# Patient Record
Sex: Male | Born: 2005 | Race: Black or African American | Hispanic: No | Marital: Single | Smoking: Never smoker
Health system: Southern US, Community
[De-identification: ages and names within clinical notes are randomized; demographics above are authoritative.]

---
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
## 2023-05-19 ENCOUNTER — Ambulatory Visit
Admission: EM | Admit: 2023-05-19 | Discharge: 2023-05-19 | Disposition: A | Discharge status: Home / Self Care | Payer: BC Managed Care – PPO | Attending: Nurse Practitioner | Admitting: Nurse Practitioner

## 2023-05-19 DIAGNOSIS — Z113 Encounter for screening for infections with a predominantly sexual mode of transmission: Secondary | ICD-10-CM | POA: Insufficient documentation

## 2023-05-19 DIAGNOSIS — R360 Urethral discharge without blood: Secondary | ICD-10-CM | POA: Diagnosis present

## 2023-05-19 DIAGNOSIS — R300 Dysuria: Secondary | ICD-10-CM | POA: Diagnosis present

## 2023-05-19 LAB — POCT URINALYSIS DIP (MANUAL ENTRY)
Bilirubin, UA: NEGATIVE
Blood, UA: NEGATIVE
Glucose, UA: NEGATIVE mg/dL
Ketones, POC UA: NEGATIVE mg/dL
Leukocytes, UA: NEGATIVE
Nitrite, UA: NEGATIVE
Protein Ur, POC: NEGATIVE mg/dL
Spec Grav, UA: 1.025 (ref 1.010–1.025)
Urobilinogen, UA: 0.2 E.U./dL
pH, UA: 8.5 — AB (ref 5.0–8.0)

## 2023-05-19 NOTE — ED Triage Notes (Signed)
Pt reports he has some burning with urination, cough, and penile discharge x 4 days.   Pt had unprotected sex with various partners about 2-3 weeks ago.

## 2023-05-19 NOTE — Discharge Instructions (Addendum)
Your test results will be available within the next 48 to 72 hours.  Refrain from sexual intercourse until you have received your test results. You will be contacted if the pending test results are positive to discuss treatment. Recommend that you use condoms with each sexual encounter. Follow-up as needed.

## 2023-05-19 NOTE — ED Provider Notes (Signed)
[facility]    CSN: [id number] Arrival date & time: 05/19/23  1837      History   Chief Complaint No chief complaint on file.   HPI Jim Parker is a 17 y.o. male.   The history is provided by the patient.   The patient presents for complaints of penile discharge and dysuria for the past 4 days.  Patient denies fever, chills, abdominal pain, urinary urgency, frequency, hesitancy,/testicular pain/swelling, low back pain, or flank pain.  Patient reports about 2-3 sexual partners within the past 90 days.  He reports prior history of chlamydia.  History reviewed. No pertinent past medical history.  There are no problems to display for this patient.   History reviewed. No pertinent surgical history.     Home Medications    Prior to Admission medications   Medication Sig Start Date End Date Taking? Authorizing Provider  albuterol (VENTOLIN HFA) 108 (90 Base) MCG/ACT inhaler Inhale 2 puffs into the lungs every 4 (four) hours as needed for wheezing or shortness of breath. 07/01/22   Berna Bue, MD  doxycycline (MONODOX) 100 MG capsule Take 100 mg by mouth 2 (two) times daily. Patient not taking: Reported on 09/24/2022    [provider]  fluticasone (FLONASE) 50 MCG/ACT nasal spray Place 1 spray into both nostrils daily. 07/01/22   Berna Bue, MD  promethazine-dextromethorphan (PROMETHAZINE-DM) 6.25-15 MG/5ML syrup Take 5 mLs by mouth 4 (four) times daily as needed. Patient not taking: Reported on 07/01/2022 11/23/21   Particia Nearing, PA-C    Family History History reviewed. No pertinent family history.  Social History Social History   Tobacco Use   Smoking status: Never   Smokeless tobacco: Never  Vaping Use   Vaping Use: Some days  Substance Use Topics   Alcohol use: No   Drug use: Yes    Types: Marijuana     Allergies   Patient has no known allergies.   Review of Systems Review of Systems Per HPI  Physical  Exam Triage Vital Signs ED Triage Vitals [05/19/23 1914]  Enc Vitals Group     BP 113/70     Pulse Rate 61     Resp 18     Temp 97.7 F (36.5 C)     Temp Source Oral     SpO2 95 %     Weight      Height      Head Circumference      Peak Flow      Pain Score 0     Pain Loc      Pain Edu?      Excl. in GC?    No data found.  Updated Vital Signs BP 113/70 (BP Location: Right Arm)   Pulse 61   Temp 97.7 F (36.5 C) (Oral)   Resp 18   SpO2 95%   Visual Acuity Right Eye Distance:   Left Eye Distance:   Bilateral Distance:    Right Eye Near:   Left Eye Near:    Bilateral Near:     Physical Exam Vitals and nursing note reviewed.  Constitutional:      General: He is not in acute distress.    Appearance: Normal appearance.  HENT:     Head: Normocephalic.  Eyes:     Extraocular Movements: Extraocular movements intact.     Pupils: Pupils are equal, round, and reactive to light.  Cardiovascular:     Rate and Rhythm: Normal rate and regular  rhythm.     Pulses: Normal pulses.     Heart sounds: Normal heart sounds.  Pulmonary:     Effort: Pulmonary effort is normal. No respiratory distress.     Breath sounds: Normal breath sounds. No stridor. No wheezing, rhonchi or rales.  Abdominal:     General: Bowel sounds are normal.     Palpations: Abdomen is soft.     Tenderness: There is no abdominal tenderness.  Genitourinary:    Comments: GU exam deferred, self swab performed  Musculoskeletal:     Cervical back: Normal range of motion.  Skin:    General: Skin is warm and dry.  Neurological:     General: No focal deficit present.     Mental Status: He is alert and oriented to person, place, and time.  Psychiatric:        Mood and Affect: Mood normal.        Behavior: Behavior normal.      UC Treatments / Results  Labs (all labs ordered are listed, but only abnormal results are displayed) Labs Reviewed  POCT URINALYSIS DIP (MANUAL ENTRY) - Abnormal; Notable for  the following components:      Result Value   pH, UA 8.5 (*)    All other components within normal limits  HIV ANTIBODY (ROUTINE TESTING W REFLEX)  RPR  CYTOLOGY, (ORAL, ANAL, URETHRAL) ANCILLARY ONLY  CYTOLOGY, (ORAL, ANAL, URETHRAL) ANCILLARY ONLY    EKG   Radiology No results found.  Procedures Procedures (including critical care time)  Medications Ordered in UC Medications - No data to display  Initial Impression / Assessment and Plan / UC Course  I have reviewed the triage vital signs and the nursing notes.  Pertinent labs & imaging results that were available during my care of the patient were reviewed by me and considered in my medical decision making (see chart for details).  The patient is well-appearing, he is in no acute distress, vital signs are stable.  Cytology test are pending along with HIV and RPR.  Urinalysis was negative for infection.  Patient advised to refrain from sexual intercourse until his test results have been received.  Encourage patient to use condoms with each sexual encounter.  Patient is in agreement with this plan of care and verbalizes understanding.  All questions were answered.  Patient stable for discharge.   okay okayFinal Clinical Impressions(s) / UC Diagnoses   Final diagnoses:  Screening examination for sexually transmitted disease  Penile discharge, without blood  Dysuria     Discharge Instructions      Your test results will be available within the next 48 to 72 hours.  Refrain from sexual intercourse until you have received your test results. You will be contacted if the pending test results are positive to discuss treatment. Recommend that you use condoms with each sexual encounter. Follow-up as needed.     ED Prescriptions   None    PDMP not reviewed this encounter.   Abran Cantor, NP 05/19/23 1954

## 2023-05-20 LAB — CYTOLOGY, (ORAL, ANAL, URETHRAL) ANCILLARY ONLY
Chlamydia: NEGATIVE
Comment: NEGATIVE
Comment: NEGATIVE
Comment: NORMAL
Neisseria Gonorrhea: NEGATIVE
Trichomonas: NEGATIVE

## 2023-05-30 LAB — HIV ANTIBODY (ROUTINE TESTING W REFLEX): HIV Screen 4th Generation wRfx: NONREACTIVE

## 2023-05-30 LAB — RPR: RPR Ser Ql: NONREACTIVE
